# Patient Record
Sex: Female | Born: 1965 | Race: White | Hispanic: No | Marital: Married | Smoking: Former smoker
Health system: Southern US, Community
[De-identification: ages and names within clinical notes are randomized; demographics above are authoritative.]

## PROBLEM LIST (undated history)

## (undated) DIAGNOSIS — I341 Nonrheumatic mitral (valve) prolapse: Secondary | ICD-10-CM

## (undated) DIAGNOSIS — J45909 Unspecified asthma, uncomplicated: Secondary | ICD-10-CM

## (undated) DIAGNOSIS — J189 Pneumonia, unspecified organism: Secondary | ICD-10-CM

## (undated) DIAGNOSIS — K219 Gastro-esophageal reflux disease without esophagitis: Secondary | ICD-10-CM

## (undated) DIAGNOSIS — E079 Disorder of thyroid, unspecified: Secondary | ICD-10-CM

## (undated) HISTORY — PX: MULTIPLE TOOTH EXTRACTIONS: SHX2053

## (undated) HISTORY — PX: KNEE SURGERY: SHX244

## (undated) HISTORY — DX: Unspecified asthma, uncomplicated: J45.909

## (undated) HISTORY — DX: Pneumonia, unspecified organism: J18.9

## (undated) HISTORY — DX: Gastro-esophageal reflux disease without esophagitis: K21.9

## (undated) HISTORY — DX: Disorder of thyroid, unspecified: E07.9

## (undated) HISTORY — PX: WISDOM TOOTH EXTRACTION: SHX21

---

## 1997-12-07 ENCOUNTER — Other Ambulatory Visit: Admission: RE | Admit: 1997-12-07 | Discharge: 1997-12-07 | Payer: Self-pay | Admitting: Gynecology

## 1999-03-06 ENCOUNTER — Other Ambulatory Visit: Admission: RE | Admit: 1999-03-06 | Discharge: 1999-03-06 | Payer: Self-pay | Admitting: Gynecology

## 2000-04-09 ENCOUNTER — Encounter (INDEPENDENT_AMBULATORY_CARE_PROVIDER_SITE_OTHER): Payer: Self-pay

## 2000-04-09 ENCOUNTER — Ambulatory Visit (HOSPITAL_COMMUNITY): Admission: RE | Admit: 2000-04-09 | Discharge: 2000-04-09 | Payer: Self-pay | Admitting: *Deleted

## 2000-04-30 ENCOUNTER — Other Ambulatory Visit: Admission: RE | Admit: 2000-04-30 | Discharge: 2000-04-30 | Payer: Self-pay | Admitting: Gynecology

## 2001-01-04 ENCOUNTER — Ambulatory Visit (HOSPITAL_BASED_OUTPATIENT_CLINIC_OR_DEPARTMENT_OTHER): Admission: RE | Admit: 2001-01-04 | Discharge: 2001-01-04 | Payer: Self-pay | Admitting: *Deleted

## 2003-01-10 ENCOUNTER — Other Ambulatory Visit: Admission: RE | Admit: 2003-01-10 | Discharge: 2003-01-10 | Payer: Self-pay | Admitting: Gynecology

## 2004-06-17 ENCOUNTER — Other Ambulatory Visit: Admission: RE | Admit: 2004-06-17 | Discharge: 2004-06-17 | Payer: Self-pay | Admitting: Gynecology

## 2005-05-21 ENCOUNTER — Emergency Department (HOSPITAL_COMMUNITY): Admission: EM | Admit: 2005-05-21 | Discharge: 2005-05-22 | Payer: Self-pay | Admitting: Emergency Medicine

## 2006-08-20 ENCOUNTER — Ambulatory Visit (HOSPITAL_COMMUNITY): Admission: RE | Admit: 2006-08-20 | Discharge: 2006-08-20 | Payer: Self-pay | Admitting: Obstetrics and Gynecology

## 2006-09-13 ENCOUNTER — Inpatient Hospital Stay (HOSPITAL_COMMUNITY): Admission: AD | Admit: 2006-09-13 | Discharge: 2006-09-15 | Payer: Self-pay | Admitting: Obstetrics and Gynecology

## 2007-07-29 ENCOUNTER — Ambulatory Visit (HOSPITAL_COMMUNITY): Admission: RE | Admit: 2007-07-29 | Discharge: 2007-07-29 | Payer: Self-pay | Admitting: Gynecology

## 2007-11-23 ENCOUNTER — Emergency Department (HOSPITAL_COMMUNITY): Admission: EM | Admit: 2007-11-23 | Discharge: 2007-11-23 | Payer: Self-pay | Admitting: Emergency Medicine

## 2010-01-14 ENCOUNTER — Emergency Department (HOSPITAL_COMMUNITY)
Admission: EM | Admit: 2010-01-14 | Discharge: 2010-01-15 | Payer: Self-pay | Source: Home / Self Care | Admitting: Emergency Medicine

## 2010-04-21 ENCOUNTER — Encounter: Payer: Self-pay | Admitting: Gynecology

## 2010-08-16 NOTE — Op Note (Signed)
[facility]  Patient:    Rhonda Mckinney, Rhonda Mckinney                     MRN: [id number] Proc. Date: 04/09/00 Adm. Date:  [id number] Attending:  Wetzel Bjornstad                           Operative Report  PREOPERATIVE DIAGNOSIS:       Six-week missed abortion.  POSTOPERATIVE DIAGNOSIS:      Six-week missed abortion.  PROCEDURE:                    Suction and dilation and curettage.  SURGEON:                      Katy Fitch, M.D.  ANESTHESIA:                   General by mask.  ESTIMATED BLOOD LOSS:         20 cc.  FLUIDS:                       750 cc of crystalloid.  COMPLICATIONS:                None.  SPECIMENS:                    Products of conception.  FINDINGS:                     An eight-week sized uterus, sounded to 8.0 cm.  DESCRIPTION OF PROCEDURE:     The patient was taken to the operating room and placed in [place] stirrups.  The patient was given general anesthesia by mask. The patient was prepped and draped in a normal sterile fashion.  A bivalve speculum was placed in the vagina and the cervix was identified.  A single tooth tenaculum was placed on the anterior lip of the cervix, and 5 cc of 1% lidocaine was injected at the 3 oclock position, and then at the 9 oclock position.  The uterus was then sounded to 8.0 cm, and the cervix was serially dilated to 25-French.  A #7 curved tipped suction was then placed into the uterus until the fundus was reached, and two passes of the suction were performed.  A sharp curetting of all four quadrants of the uterus was then performed until gritty texture was noted.  Then a repeat suctioning of the uterus was performed.  The single tooth tenaculum was then removed and all hemostasis was noted.  The speculum was removed.  The patient was taken to the recovery room in stable condition. DD:  04/09/00 TD:  04/09/00 Job: [id number]

## 2010-08-16 NOTE — H&P (Signed)
[facility] of [facility]  Patient:    Rhonda Mckinney, Rhonda Mckinney                       MRN: [id number] Attending:  Katy Fitch, M.D.                         History and Physical  CHIEF COMPLAINT:              Missed abortion.  HISTORY OF PRESENT ILLNESS:   Patient is a 45 year old G2, P1 at [redacted] weeks gestation who had one week of spotting over the past week without any cramping or heavy bleeding.  Patient came into office on April 08, 2000 and had an ultrasound noting a fetal pole without any cardiac activity.  Fetus was scanned to be approximately 6 weeks and 1 day and no other abnormalities were noted.  Patient denies any fever, nausea or vomiting, chills, abdominal pain or change in bowel or bladder habits.  PAST MEDICAL HISTORY:         None.  PAST SURGICAL HISTORY:        None.  PAST OBSTETRICAL HISTORY:     Spontaneous vaginal delivery.  PAST GYNECOLOGICAL HISTORY:   History of ASCUS.  MEDICATIONS:                  None.  ALLERGIES:                    None.  SOCIAL HISTORY:               Patient is married.  Denies any tobacco, alcohol or drugs.  FAMILY HISTORY:               Without any epithelial cancers.  PHYSICAL EXAMINATION  VITAL SIGNS:                  Blood pressure 117/80.  HEENT:                        Clear.  LUNGS:                        Clear to auscultation bilaterally.  HEART:                        Regular rate and rhythm, without any murmurs, rubs, or gallops.  ABDOMEN:                      Soft, nontender.  No masses.  Normal abdominal bowel sounds.  PELVIC:                       Cervix closed.  Uterus firm, mobile and nontender.  ASSESSMENT AND PLAN:          [age]-year-old gravida 2, para 1 at 6 weeks with missed abortion.  Blood type is A-positive.  Will undergo suction, dilatation and curettage on April 10, 1999; patient will then follow up in two weeks. DD:  04/09/00 TD:  04/09/00 Job: [id number]

## 2010-08-16 NOTE — Op Note (Signed)
[place]  Patient:    [name], [name] Visit Number: [id number] MRN: [id number]          Service Type: DSU Location: [facility] Attending Physician:  Twana First Dictated by:   Elana Alm Thurston Hole, M.D. Proc. Date: 01/04/01 Admit Date:  01/04/2001                             Operative Report  PREOPERATIVE DIAGNOSIS:  Right knee chondromalacia and synovitis.  POSTOPERATIVE DIAGNOSIS:  Right knee chondromalacia and synovitis.  PROCEDURE: 1. Right knee EUA followed by arthroscopic chondroplasty. 2. Right knee partial synovectomy.  SURGEON:  Elana Alm. Thurston Hole, M.D.  ASSISTANT:  Julien Girt, P.A.  ANESTHESIA:  General.  OPERATIVE TIME:  30 minutes.  COMPLICATIONS:  None.  INDICATIONS FOR PROCEDURE:  Ms. Rhonda Mckinney is a 45 year old woman who has had significant right knee pain on and off for many years, much worse over the past two to three months, increasing in nature, with signs and symptoms and MRI documenting chondromalacia and synovitis who has failed conservative care and is now to undergo arthroscopy.  DESCRIPTION:  Ms. Rhonda Mckinney is brought to the operating room on January 04, 2001, placed on the operative table in supine position.  She received Ancef 1 g IV preoperatively for prophylaxis.  Her right knee was examined under anesthesia.  Range of motion 0-125 degrees, 1-2+ crepitation, knee stable ligamentous exam with normal patella tracking.  The right leg was prepped using sterile Betadine and draped using sterile technique.  Originally through an inferolateral portal the arthroscope with a pump attached was placed, and through an inferior medial portal an arthroscopic probe was placed.  On initial inspection of the medial compartment, the articular cartilage, medial femoral condyle showed 75% grade 3 and 10% grade 4 chondromalacia which was thoroughly debrided.  The medial tibial plateau showed 30-40% grade 3 chondromalacia  which was debrided.  The medial meniscus was probed and this was found to be intact.  ACL, PCL was normal.  The lateral compartment articular cartilage was normal, lateral meniscus normal.  Patellofemoral joint showed 25% grade 3 chondromalacia on the femoral groove and the medial patella facet, which was debrided.  The patella tracked normally.  Otherwise, grade 1 and 2 changes noted in the patellofemoral joint.  Significant synovitis in the medial and lateral gutters was debrided and the synovial bleeders were cauterized; otherwise they were free of pathology.  After this was done it was felt that all pathology had been satisfactorily addressed.  The instruments were removed.  Portals closed with 3-0 nylon suture and injected with 0.25% Marcaine with epinephrine and morphine 4 mg.  Sterile dressings applied and the patient awakened and taken to the recovery room in stable condition.  FOLLOW-UP CARE:  Ms. Rhonda Mckinney will be followed as an outpatient on Vicodin and Naprosyn.  See her back in the office in a week for sutures out and follow-up. Dictated by:   Elana Alm Thurston Hole, M.D. Attending Physician:  Twana First DD:  01/04/01 TD:  01/04/01 Job: [id number]

## 2011-01-15 LAB — CBC
HCT: 31.3 — ABNORMAL LOW
HCT: 33.8 — ABNORMAL LOW
Hemoglobin: 10.7 — ABNORMAL LOW
Hemoglobin: 11.6 — ABNORMAL LOW
MCHC: 34.1
RBC: 3.55 — ABNORMAL LOW
RBC: 3.82 — ABNORMAL LOW
RBC: 4.11
RDW: 14.5 — ABNORMAL HIGH
WBC: 17 — ABNORMAL HIGH
WBC: 17.9 — ABNORMAL HIGH

## 2011-01-15 LAB — RPR: RPR Ser Ql: NONREACTIVE

## 2012-04-26 ENCOUNTER — Encounter (HOSPITAL_COMMUNITY): Payer: Self-pay | Admitting: *Deleted

## 2012-04-26 ENCOUNTER — Emergency Department (HOSPITAL_COMMUNITY)
Admission: EM | Admit: 2012-04-26 | Discharge: 2012-04-26 | Disposition: A | Discharge status: Home / Self Care | Payer: Self-pay | Attending: Emergency Medicine | Admitting: Emergency Medicine

## 2012-04-26 DIAGNOSIS — M7120 Synovial cyst of popliteal space [Baker], unspecified knee: Secondary | ICD-10-CM | POA: Insufficient documentation

## 2012-04-26 DIAGNOSIS — X58XXXA Exposure to other specified factors, initial encounter: Secondary | ICD-10-CM | POA: Insufficient documentation

## 2012-04-26 DIAGNOSIS — Y929 Unspecified place or not applicable: Secondary | ICD-10-CM | POA: Insufficient documentation

## 2012-04-26 DIAGNOSIS — Y939 Activity, unspecified: Secondary | ICD-10-CM | POA: Insufficient documentation

## 2012-04-26 DIAGNOSIS — Z79899 Other long term (current) drug therapy: Secondary | ICD-10-CM | POA: Insufficient documentation

## 2012-04-26 MED ORDER — HYDROCODONE-ACETAMINOPHEN 5-325 MG PO TABS: 1.0000 | ORAL_TABLET | ORAL | Status: DC | PRN

## 2012-04-26 MED ORDER — ACETAMINOPHEN 325 MG PO TABS
650.0000 mg | ORAL_TABLET | Freq: Once | ORAL | Status: AC
  Administered 2012-04-26: 650 mg via ORAL
  Filled 2012-04-26: qty 2

## 2012-04-26 NOTE — ED Notes (Signed)
Pt reports pain behind her (L) knee that started 4-5 days ago.  Denies long car rides, denies injury.  No redness, bruising or warmth noted.  Pt ambulatory.

## 2012-04-26 NOTE — ED Provider Notes (Signed)
History  This chart was scribed for Rhonda Booze, MD by Marlin Canary ED Scribe. The patient was seen in room TR08C/TR08C. Patient's care was started at 2104.  CSN: [id number]  Arrival date & time 04/26/12  2104   First MD Initiated Contact with Patient 04/26/12 2125      Chief Complaint  Patient presents with  . Leg Injury    The history is provided by the patient. No language interpreter was used.  Rhonda Mckinney is a 47 y.o. female who presents to the Emergency Department complaining of constant moderate left knee pain with an onset of 4-5 days ago. She states the pain is located behind her knee and has been gradually worsening. She says she does a lot of bending, twisting at work. She denies any recent long travels, surgeries or known injuries. Pt is not on birth control and denies smoking.  History reviewed. No pertinent past medical history.  No past surgical history on file.  No family history on file.  History  Substance Use Topics  . Smoking status: Not on file  . Smokeless tobacco: Not on file  . Alcohol Use: Not on file      Review of Systems  Musculoskeletal: Positive for arthralgias.  All other systems reviewed and are negative.    Allergies  Asa  Home Medications   Current Outpatient Rx  Name  Route  Sig  Dispense  Refill  . ESCITALOPRAM OXALATE 10 MG PO TABS   Oral   Take 10 mg by mouth daily.         . IBUPROFEN 200 MG PO TABS   Oral   Take 800 mg by mouth every 6 (six) hours as needed. For pain         . VITAMIN C 500 MG PO TABS   Oral   Take 1,000 mg by mouth daily.           BP 149/60  Pulse 73  Temp 98.7 F (37.1 C) (Oral)  Resp 20  SpO2 98%  Physical Exam  Nursing note and vitals reviewed. Constitutional: She is oriented to person, place, and time. She appears well-developed and well-nourished. No distress.  HENT:  Head: Normocephalic and atraumatic.  Eyes: EOM are normal.  Neck: Neck supple. No tracheal deviation  present.  Cardiovascular: Normal rate, regular rhythm and normal heart sounds.   Pulmonary/Chest: Effort normal and breath sounds normal. No respiratory distress.  Abdominal: Soft. Bowel sounds are normal. There is no tenderness.  Musculoskeletal: Normal range of motion. She exhibits tenderness.       Mild swelling to the left popliteal. Mild tenderness in the left calf. No instability, cords or difference in calf circumference noted.    Neurological: She is alert and oriented to person, place, and time.  Skin: Skin is warm and dry.  Psychiatric: She has a normal mood and affect. Her behavior is normal.    ED Course  Procedures (including critical care time) DIAGNOSTIC STUDIES: Oxygen Saturation is 98% on room air, Normal  by my interpretation.    COORDINATION OF CARE: 2143-Patient informed of current plan for treatment and evaluation and agrees with plan at this time.   Results for orders placed during the hospital encounter of 04/26/12  D-DIMER, QUANTITATIVE      Component Value Range   D-Dimer, Quant <0.27  0.00 - 0.48 ug/mL-FEU     1. Baker's cyst of knee       MDM  Left knee pain most  consistent with Baker's cyst. Venous imaging is not available, so d-dimer will be obtained to try and rule out DVT.  D-dimer is come back normal. She will be sent home with instructions to take anti-inflammatory doses of either naproxen or ibuprofen. Prescription is given for Norco for pain and she is referred to orthopedics for followup.      I personally performed the services described in this documentation, which was scribed in my presence. The recorded information has been reviewed and is accurate.      Rhonda Booze, MD 04/26/12 2225

## 2013-03-23 ENCOUNTER — Emergency Department (HOSPITAL_COMMUNITY)
Admission: EM | Admit: 2013-03-23 | Discharge: 2013-03-23 | Disposition: A | Discharge status: Home / Self Care | Payer: Self-pay | Attending: Emergency Medicine | Admitting: Emergency Medicine

## 2013-03-23 ENCOUNTER — Encounter (HOSPITAL_COMMUNITY): Payer: Self-pay | Admitting: Emergency Medicine

## 2013-03-23 ENCOUNTER — Emergency Department (HOSPITAL_COMMUNITY): Payer: Self-pay

## 2013-03-23 DIAGNOSIS — Z79899 Other long term (current) drug therapy: Secondary | ICD-10-CM | POA: Insufficient documentation

## 2013-03-23 DIAGNOSIS — B349 Viral infection, unspecified: Secondary | ICD-10-CM

## 2013-03-23 DIAGNOSIS — J029 Acute pharyngitis, unspecified: Secondary | ICD-10-CM | POA: Insufficient documentation

## 2013-03-23 DIAGNOSIS — R52 Pain, unspecified: Secondary | ICD-10-CM | POA: Insufficient documentation

## 2013-03-23 DIAGNOSIS — R51 Headache: Secondary | ICD-10-CM | POA: Insufficient documentation

## 2013-03-23 DIAGNOSIS — R1110 Vomiting, unspecified: Secondary | ICD-10-CM | POA: Insufficient documentation

## 2013-03-23 DIAGNOSIS — Z3202 Encounter for pregnancy test, result negative: Secondary | ICD-10-CM | POA: Insufficient documentation

## 2013-03-23 DIAGNOSIS — Z8679 Personal history of other diseases of the circulatory system: Secondary | ICD-10-CM | POA: Insufficient documentation

## 2013-03-23 DIAGNOSIS — R000 Tachycardia, unspecified: Secondary | ICD-10-CM | POA: Insufficient documentation

## 2013-03-23 DIAGNOSIS — B9789 Other viral agents as the cause of diseases classified elsewhere: Secondary | ICD-10-CM | POA: Insufficient documentation

## 2013-03-23 HISTORY — DX: Nonrheumatic mitral (valve) prolapse: I34.1

## 2013-03-23 LAB — URINALYSIS, ROUTINE W REFLEX MICROSCOPIC
Bilirubin Urine: NEGATIVE
Glucose, UA: NEGATIVE mg/dL
Hgb urine dipstick: NEGATIVE
Ketones, ur: 15 mg/dL — AB
Protein, ur: NEGATIVE mg/dL
Urobilinogen, UA: 0.2 mg/dL (ref 0.0–1.0)

## 2013-03-23 LAB — CBC WITH DIFFERENTIAL/PLATELET
Eosinophils Absolute: 0.1 10*3/uL (ref 0.0–0.7)
Eosinophils Relative: 1 % (ref 0–5)
HCT: 38 % (ref 36.0–46.0)
Lymphs Abs: 1 10*3/uL (ref 0.7–4.0)
MCH: 27.5 pg (ref 26.0–34.0)
MCV: 83.5 fL (ref 78.0–100.0)
Monocytes Absolute: 1.2 10*3/uL — ABNORMAL HIGH (ref 0.1–1.0)
Platelets: 412 10*3/uL — ABNORMAL HIGH (ref 150–400)
RDW: 14.5 % (ref 11.5–15.5)

## 2013-03-23 LAB — BASIC METABOLIC PANEL
CO2: 22 mEq/L (ref 19–32)
Calcium: 9 mg/dL (ref 8.4–10.5)
Creatinine, Ser: 0.68 mg/dL (ref 0.50–1.10)
GFR calc non Af Amer: >90 mL/min (ref >90)
Glucose, Bld: 118 mg/dL — ABNORMAL HIGH (ref 70–99)
Sodium: 136 mEq/L (ref 135–145)

## 2013-03-23 LAB — URINE MICROSCOPIC-ADD ON

## 2013-03-23 LAB — PREGNANCY, URINE: Preg Test, Ur: NEGATIVE

## 2013-03-23 MED ORDER — SODIUM CHLORIDE 0.9 % IV BOLUS (SEPSIS)
1000.0000 mL | Freq: Once | INTRAVENOUS | Status: AC
  Administered 2013-03-23: 1000 mL via INTRAVENOUS

## 2013-03-23 MED ORDER — IBUPROFEN 800 MG PO TABS
800.0000 mg | ORAL_TABLET | Freq: Once | ORAL | Status: AC
  Administered 2013-03-23: 800 mg via ORAL
  Filled 2013-03-23: qty 1

## 2013-03-23 NOTE — ED Provider Notes (Signed)
CSN: [id number]     Arrival date & time 03/23/13  1049 History   None    Chief Complaint  Patient presents with  . Influenza    HPI  Rhonda Mckinney is a 47 y.o. female with a PMH of MVP who presents to the ED for evaluation of influenza.  History was provided by the patient.  Patient states she has had "flu like symptoms" x 1 week.  She states "I think I have pneumonia."  Her symptoms include fatigue, generalized weakness, headache, nasal congestion, productive cough, myalgias, and sore throat.  She is unsure what her sputum looks like but denies hemoptysis.  Her sore throat feels "scratchy."  She also reports subjective fever and chills but did not take her temperature.  She had one episode of emesis this morning.  She denies any chest pain, SOB, wheezing, focal weakness, abdominal pain, diarrhea, vaginal discharge, dysuria, vaginal bleeding/discharge, vision changes, syncope, lightheadedness, or dizziness.  She took Ibuprofen PTA which improved her headache.  She denies any known sick contacts.  No recent travel.  She did not get the flu shot this year.     Past Medical History  Diagnosis Date  . Mitral valve prolapse    History reviewed. No pertinent past surgical history. History reviewed. No pertinent family history. History  Substance Use Topics  . Smoking status: Not on file  . Smokeless tobacco: Not on file  . Alcohol Use: No   OB History   Grav Para Term Preterm Abortions TAB SAB Ect Mult Living                 Review of Systems  Constitutional: Positive for fever, chills and fatigue. Negative for diaphoresis, activity change and appetite change.  HENT: Positive for congestion, postnasal drip, rhinorrhea and sore throat. Negative for drooling, ear pain, mouth sores, sinus pressure, tinnitus, trouble swallowing and voice change.   Eyes: Negative for photophobia, pain and visual disturbance.  Respiratory: Positive for cough. Negative for apnea, choking, shortness of breath  and wheezing.   Cardiovascular: Negative for chest pain and leg swelling.  Gastrointestinal: Positive for vomiting. Negative for nausea, abdominal pain, diarrhea and constipation.  Genitourinary: Negative for dysuria, vaginal bleeding, vaginal discharge and difficulty urinating.  Musculoskeletal: Positive for myalgias. Negative for back pain, gait problem, neck pain and neck stiffness.  Skin: Negative for rash and wound.  Neurological: Positive for weakness (generalized weakness) and headaches. Negative for dizziness and light-headedness.    Allergies  Asa  Home Medications   Current Outpatient Rx  Name  Route  Sig  Dispense  Refill  . escitalopram (LEXAPRO) 10 MG tablet   Oral   Take 10 mg by mouth daily.         Marland Kitchen ibuprofen (ADVIL,MOTRIN) 200 MG tablet   Oral   Take 800 mg by mouth every 6 (six) hours as needed. For pain         . Pseudoeph-Doxylamine-DM-APAP (NYQUIL PO)   Oral   Take 2 capsules by mouth daily as needed (congestion).         . vitamin C (ASCORBIC ACID) 500 MG tablet   Oral   Take 1,000 mg by mouth daily.          BP 116/69  Pulse 120  Temp(Src) 101.3 F (38.5 C) (Oral)  Resp 18  SpO2 96%  Filed Vitals:   03/23/13 1118 03/23/13 1343 03/23/13 1417 03/23/13 1527  BP: 115/66  109/60 115/68  Pulse: 113 91  92 87  Temp: 99.7 F (37.6 C) 99.4 F (37.4 C) 98.1 F (36.7 C) 99.5 F (37.5 C)  TempSrc: Oral Oral Oral Oral  Resp: 20 21 16 20   SpO2: 96% 97% 97% 99%    Physical Exam  Nursing note and vitals reviewed. Constitutional: She is oriented to person, place, and time. She appears well-developed and well-nourished. No distress.  Ill appearing  HENT:  Head: Normocephalic and atraumatic.  Right Ear: External ear normal.  Left Ear: External ear normal.  Nose: Nose normal.  Mouth/Throat: Oropharynx is clear and moist. No oropharyngeal exudate.  Tympanic membranes gray and translucent bilaterally.  No erythema or exudates to the posterior  pharynx.  Nasal congestion.    Eyes: Conjunctivae are normal. Pupils are equal, round, and reactive to light. Right eye exhibits no discharge. Left eye exhibits no discharge.  Neck: Normal range of motion. Neck supple.  No LAD.  No cervical spinal or paraspinal tenderness to palpation throughout.  No limitations with neck ROM.    Cardiovascular: Regular rhythm, normal heart sounds and intact distal pulses.  Exam reveals no gallop and no friction rub.   No murmur heard. Tachycardic  Pulmonary/Chest: Effort normal and breath sounds normal. No respiratory distress. She has no wheezes. She has no rales. She exhibits no tenderness.  Abdominal: Soft. Bowel sounds are normal. She exhibits no distension and no mass. There is no tenderness. There is no rebound and no guarding.  Musculoskeletal: Normal range of motion. She exhibits no edema and no tenderness.  Neurological: She is alert and oriented to person, place, and time.  Skin: Skin is warm and dry. She is not diaphoretic.    ED Course  Procedures (including critical care time) Labs Review Labs Reviewed - No data to display Imaging Review No results found.  EKG Interpretation   None      Results for orders placed during the hospital encounter of 03/23/13  CBC WITH DIFFERENTIAL      Result Value Range   WBC 14.8 (*) 4.0 - 10.5 K/uL   RBC 4.55  3.87 - 5.11 MIL/uL   Hemoglobin 12.5  12.0 - 15.0 g/dL   HCT 40.9  81.1 - 91.4 %   MCV 83.5  78.0 - 100.0 fL   MCH 27.5  26.0 - 34.0 pg   MCHC 32.9  30.0 - 36.0 g/dL   RDW 78.2  95.6 - 21.3 %   Platelets 412 (*) 150 - 400 K/uL   Neutrophils Relative % 84 (*) 43 - 77 %   Neutro Abs 12.5 (*) 1.7 - 7.7 K/uL   Lymphocytes Relative 7 (*) 12 - 46 %   Lymphs Abs 1.0  0.7 - 4.0 K/uL   Monocytes Relative 8  3 - 12 %   Monocytes Absolute 1.2 (*) 0.1 - 1.0 K/uL   Eosinophils Relative 1  0 - 5 %   Eosinophils Absolute 0.1  0.0 - 0.7 K/uL   Basophils Relative 0  0 - 1 %   Basophils Absolute 0.0   0.0 - 0.1 K/uL  BASIC METABOLIC PANEL      Result Value Range   Sodium 136  135 - 145 mEq/L   Potassium 3.1 (*) 3.5 - 5.1 mEq/L   Chloride 101  96 - 112 mEq/L   CO2 22  19 - 32 mEq/L   Glucose, Bld 118 (*) 70 - 99 mg/dL   BUN 8  6 - 23 mg/dL   Creatinine, Ser 0.86  0.50 -  1.10 mg/dL   Calcium 9.0  8.4 - 40.9 mg/dL   GFR calc non Af Amer >90  >90 mL/min   GFR calc Af Amer >90  >90 mL/min  URINALYSIS, ROUTINE W REFLEX MICROSCOPIC      Result Value Range   Color, Urine YELLOW  YELLOW   APPearance CLEAR  CLEAR   Specific Gravity, Urine 1.010  1.005 - 1.030   pH 6.0  5.0 - 8.0   Glucose, UA NEGATIVE  NEGATIVE mg/dL   Hgb urine dipstick NEGATIVE  NEGATIVE   Bilirubin Urine NEGATIVE  NEGATIVE   Ketones, ur 15 (*) NEGATIVE mg/dL   Protein, ur NEGATIVE  NEGATIVE mg/dL   Urobilinogen, UA 0.2  0.0 - 1.0 mg/dL   Nitrite NEGATIVE  NEGATIVE   Leukocytes, UA TRACE (*) NEGATIVE  PREGNANCY, URINE      Result Value Range   Preg Test, Ur NEGATIVE  NEGATIVE  URINE MICROSCOPIC-ADD ON      Result Value Range   Squamous Epithelial / LPF FEW (*) RARE   WBC, UA 0-2  <3 WBC/hpf   Bacteria, UA RARE  RARE   Urine-Other MUCOUS PRESENT     DG Chest 2 View (Final result)  Result time: 03/23/13 12:04:38    Final result by Rad Results In Interface (03/23/13 12:04:38)    Narrative:   CLINICAL DATA: Chest pain, weakness and cough.  EXAM: CHEST - 2 VIEW  COMPARISON: 05/21/2005  FINDINGS: The heart size and mediastinal contours are within normal limits. There is no evidence of pulmonary edema, consolidation, pneumothorax, nodule or pleural fluid. The visualized skeletal structures are unremarkable.  IMPRESSION: No active disease.   Electronically Signed By: Irish Lack M.D. On: 03/23/2013 12:04      MDM   ELYN KROGH is a 47 y.o. female with a PMH of MVP who presents to the ED for evaluation of influenza.   Rechecks  1:00 PM = Patient states she feels tired.  Ordering  another liter of fluids.  Pain controlled.   3:35 PM = Patient states she feels a little better.  Myalgias returning.  Ibuprofen ordered.      Patient likely has influenza or another viral syndrome.  Patient had a fever of 101.3 which reduced in the ED.  She had improvements in her symptoms with IV fluids.  Chest x-ray negative for an acute cardiopulmonary process.  Urine negative for a UTI.  Mild leukocytosis, however, doubt bacterial process.  Patient has URI like complaints including nasal congestion, rhinorrhea, sore throat, and cough.  No meningeal signs/stiff neck/severe headache/neck pain. Mild hypokalemia likely from decreased intake and vomiting.  Patient encouraged to eat a potassium rich diet.  Instructions provided.  Tachycardia resolved with IV fluids, which was likely due to dehydration.  Patient encouraged to rest and drink fluids.  Encouraged to follow-up with her PCP if her symptoms are not improving or worsening. Return precautions, discharge instructions, and follow-up was discussed with the patient before discharge.     Discharge Medication List as of 03/23/2013  3:41 PM       Final impressions: 1. Viral syndrome      Luiz Iron PA-C   This patient was discussed with Dr. Verne Carrow, PA-C 03/25/13 [phone]

## 2013-03-23 NOTE — ED Notes (Signed)
PT endorses "head cold" for few weeks. Fever since yesterday. Non-productive cough, emesis. Ibuprofen 800mg  taken PTA. Nyquil taken at 0200.

## 2013-03-23 NOTE — ED Notes (Signed)
Pt reports flu like symptoms. Had cold symptoms for weeks and now having n/v x 1, body pain, productive cough and fever/chills. Mask on pt at triage.

## 2013-04-07 NOTE — ED Provider Notes (Signed)
Medical screening examination/treatment/procedure(s) were performed by non-physician practitioner and as supervising physician I was immediately available for consultation/collaboration.  EKG Interpretation   None        Derwood KaplanAnkit Clayborn Milnes, MD 04/07/13 1815

## 2015-08-22 ENCOUNTER — Encounter (HOSPITAL_COMMUNITY): Payer: Self-pay | Admitting: Family Medicine

## 2015-08-22 ENCOUNTER — Emergency Department (HOSPITAL_COMMUNITY): Payer: Self-pay

## 2015-08-22 ENCOUNTER — Emergency Department (HOSPITAL_COMMUNITY)
Admission: EM | Admit: 2015-08-22 | Discharge: 2015-08-22 | Disposition: A | Discharge status: Home / Self Care | Payer: Self-pay | Attending: Emergency Medicine | Admitting: Emergency Medicine

## 2015-08-22 DIAGNOSIS — R0982 Postnasal drip: Secondary | ICD-10-CM | POA: Insufficient documentation

## 2015-08-22 DIAGNOSIS — J029 Acute pharyngitis, unspecified: Secondary | ICD-10-CM | POA: Insufficient documentation

## 2015-08-22 DIAGNOSIS — Z8679 Personal history of other diseases of the circulatory system: Secondary | ICD-10-CM | POA: Insufficient documentation

## 2015-08-22 DIAGNOSIS — J3489 Other specified disorders of nose and nasal sinuses: Secondary | ICD-10-CM | POA: Insufficient documentation

## 2015-08-22 DIAGNOSIS — R509 Fever, unspecified: Secondary | ICD-10-CM | POA: Insufficient documentation

## 2015-08-22 DIAGNOSIS — R05 Cough: Secondary | ICD-10-CM | POA: Insufficient documentation

## 2015-08-22 DIAGNOSIS — Z79899 Other long term (current) drug therapy: Secondary | ICD-10-CM | POA: Insufficient documentation

## 2015-08-22 DIAGNOSIS — R059 Cough, unspecified: Secondary | ICD-10-CM

## 2015-08-22 MED ORDER — AZITHROMYCIN 250 MG PO TABS: 250.0000 mg | ORAL_TABLET | Freq: Every day | ORAL | Status: DC

## 2015-08-22 MED ORDER — BENZONATATE 100 MG PO CAPS: 200.0000 mg | ORAL_CAPSULE | Freq: Two times a day (BID) | ORAL | Status: DC | PRN

## 2015-08-22 NOTE — Discharge Instructions (Signed)

## 2015-08-22 NOTE — ED Provider Notes (Signed)
CSN: [id number]     Arrival date & time 08/22/15  1120 History  By signing my name below, I, Essence Howell, attest that this documentation has been prepared under the direction and in the presence of Roxy Horseman, PA-C. Electronically Signed: Charline Bills, ED Scribe 08/22/2015 at 12:28 PM.    Chief Complaint  Patient presents with  . Cough  . Fever   HPI Comments: Patient presents to the emergency department with chief complaint of cough, fever, and sore throat. She states that she has been sick for the past 2 weeks. She states that she was starting to feel better, but then her symptoms worsened. She reports some associated nausea, but denies any vomiting. She's been taking DayQuil with modest relief. She denies any other associated symptoms. There are no modifying factors.  The history is provided by the patient. No language interpreter was used.     Past Medical History  Diagnosis Date  . Mitral valve prolapse    History reviewed. No pertinent past surgical history. History reviewed. No pertinent family history. Social History  Substance Use Topics  . Smoking status: Never Smoker   . Smokeless tobacco: None  . Alcohol Use: No   OB History    No data available     Review of Systems  Constitutional: Positive for fever and chills.  HENT: Positive for postnasal drip, rhinorrhea, sinus pressure, sneezing and sore throat.   Respiratory: Positive for cough. Negative for shortness of breath.   Cardiovascular: Negative for chest pain.  Gastrointestinal: Negative for nausea, vomiting, abdominal pain, diarrhea and constipation.  Genitourinary: Negative for dysuria.      Allergies  Asa  Home Medications   Prior to Admission medications   Medication Sig Start Date End Date Taking? Authorizing Provider  escitalopram (LEXAPRO) 10 MG tablet Take 10 mg by mouth daily.    Historical Provider, MD  ibuprofen (ADVIL,MOTRIN) 200 MG tablet Take 800 mg by mouth every 6 (six) hours as  needed. For pain    Historical Provider, MD  Pseudoeph-Doxylamine-DM-APAP (NYQUIL PO) Take 2 capsules by mouth daily as needed (congestion).    Historical Provider, MD  vitamin C (ASCORBIC ACID) 500 MG tablet Take 1,000 mg by mouth daily.    Historical Provider, MD   Triage Vitals: BP 120/75 mmHg  Pulse 109  Temp(Src) 98.7 F (37.1 C) (Oral)  Resp 18  Ht  (1.676 m)  Wt 167 lb (75.751 kg)  BMI 26.97 kg/m2  SpO2 93%  LMP 08/18/2015 Physical Exam Physical Exam  Constitutional: Pt  is oriented to person, place, and time. Appears well-developed and well-nourished. No distress.  HENT:  Head: Normocephalic and atraumatic.  Right Ear: Tympanic membrane, external ear and ear canal normal.  Left Ear: Tympanic membrane, external ear and ear canal normal.  Nose: Mucosal edema and moderate rhinorrhea present. No epistaxis. Right sinus exhibits no maxillary sinus tenderness and no frontal sinus tenderness. Left sinus exhibits no maxillary sinus tenderness and no frontal sinus tenderness.  Mouth/Throat: Uvula is midline and mucous membranes are normal. Mucous membranes are not pale and not cyanotic. No oropharyngeal exudate, posterior oropharyngeal edema, posterior oropharyngeal erythema or tonsillar abscesses.  Eyes: Conjunctivae are normal. Pupils are equal, round, and reactive to light.  Neck: Normal range of motion and full passive range of motion without pain.  Cardiovascular: Normal rate and intact distal pulses.   Pulmonary/Chest: Effort normal and breath sounds normal. No stridor.  Clear and equal breath sounds without focal wheezes, rhonchi, rales  Abdominal: Soft. Bowel sounds are normal. There is no tenderness.  Musculoskeletal: Normal range of motion.  Lymphadenopathy:    Pthas no cervical adenopathy.  Neurological: Pt is alert and oriented to person, place, and time.  Skin: Skin is warm and dry. No rash noted. Pt is not diaphoretic.  Psychiatric: Normal mood and affect.  Nursing  note and vitals reviewed.   ED Course  Procedures  DIAGNOSTIC STUDIES: Oxygen Saturation is 93% on RA, adequate by my interpretation.      Labs Review Labs Reviewed - No data to display  Imaging Review Dg Chest 2 View  08/22/2015  CLINICAL DATA:  Cough and congestion for 3 days EXAM: CHEST  2 VIEW COMPARISON:  03/23/2013 FINDINGS: Cardiac shadow is within normal limits. The lungs are well aerated bilaterally. No focal infiltrate or sizable effusion is seen. No acute bony abnormality is noted. IMPRESSION: No active cardiopulmonary disease. Electronically Signed   By: Alcide CleverMark  Lukens M.D.   On: 08/22/2015 12:19   I have personally reviewed and evaluated these images and lab results as part of my medical decision-making.   EKG Interpretation None      MDM   Final diagnoses:  Cough    Pt CXR negative for acute infiltrate. Patients symptoms are consistent with URI, likely viral etiology, but as she has worsened in the past week, may benefit from a short course of azithromycin. Pt will be discharged with symptomatic treatment.  Verbalizes understanding and is agreeable with plan. Pt is hemodynamically stable & in NAD prior to dc.  I personally performed the services described in this documentation, which was scribed in my presence. The recorded information has been reviewed and is accurate.      Roxy HorsemanRobert Garey Alleva, PA-C 08/22/15 1242  Rolland PorterMark James, MD 09/06/15 2245

## 2015-08-22 NOTE — ED Notes (Signed)
Pt here for cough, fever, sore throat. sts some nausea. sts she has been taking dayquil

## 2018-10-26 ENCOUNTER — Emergency Department (HOSPITAL_COMMUNITY)
Admission: EM | Admit: 2018-10-26 | Discharge: 2018-10-26 | Discharge status: Against Medical Advice | Payer: Self-pay | Attending: Emergency Medicine | Admitting: Emergency Medicine

## 2018-10-26 ENCOUNTER — Encounter (HOSPITAL_COMMUNITY): Payer: Self-pay | Admitting: Emergency Medicine

## 2018-10-26 ENCOUNTER — Other Ambulatory Visit: Payer: Self-pay

## 2018-10-26 ENCOUNTER — Emergency Department (HOSPITAL_COMMUNITY): Payer: Self-pay

## 2018-10-26 DIAGNOSIS — Z5321 Procedure and treatment not carried out due to patient leaving prior to being seen by health care provider: Secondary | ICD-10-CM | POA: Insufficient documentation

## 2018-10-26 LAB — CBC
HCT: 41.9 % (ref 36.0–46.0)
Hemoglobin: 13.7 g/dL (ref 12.0–15.0)
MCH: 28.3 pg (ref 26.0–34.0)
MCHC: 32.7 g/dL (ref 30.0–36.0)
MCV: 86.6 fL (ref 80.0–100.0)
Platelets: 431 10*3/uL — ABNORMAL HIGH (ref 150–400)
RBC: 4.84 MIL/uL (ref 3.87–5.11)
RDW: 14.6 % (ref 11.5–15.5)
WBC: 10.2 10*3/uL (ref 4.0–10.5)
nRBC: 0 % (ref 0.0–0.2)

## 2018-10-26 LAB — BASIC METABOLIC PANEL
Anion gap: 12 (ref 5–15)
BUN: 8 mg/dL (ref 6–20)
CO2: 21 mmol/L — ABNORMAL LOW (ref 22–32)
Calcium: 9.3 mg/dL (ref 8.9–10.3)
Chloride: 105 mmol/L (ref 98–111)
Creatinine, Ser: 0.83 mg/dL (ref 0.44–1.00)
GFR calc Af Amer: >60 mL/min (ref >60)
GFR calc non Af Amer: >60 mL/min (ref >60)
Glucose, Bld: 123 mg/dL — ABNORMAL HIGH (ref 70–99)
Potassium: 3.5 mmol/L (ref 3.5–5.1)
Sodium: 138 mmol/L (ref 135–145)

## 2018-10-26 LAB — TROPONIN I (HIGH SENSITIVITY): Troponin I (High Sensitivity): 2 ng/L (ref <18)

## 2018-10-26 MED ORDER — SODIUM CHLORIDE 0.9% FLUSH: 3.0000 mL | Freq: Once | INTRAVENOUS | Status: DC

## 2018-10-26 NOTE — ED Notes (Signed)
Patient decided to leave, advised to stay.

## 2018-10-26 NOTE — ED Triage Notes (Signed)
Pt reports having numbness to her L arm and leg starting today around 12. Pt doe snot have any neuro deficits at this time. Pt reports intermittent chest pain x 2-3 weeks PCP started pt on Protonix with no relief so pt discontinued.  Pt also reports intermittent swelling to her hands and feet.

## 2019-07-15 ENCOUNTER — Encounter: Payer: Self-pay | Admitting: Gastroenterology

## 2019-08-12 ENCOUNTER — Ambulatory Visit: Payer: Self-pay | Admitting: Gastroenterology

## 2019-09-15 ENCOUNTER — Ambulatory Visit: Payer: No Typology Code available for payment source | Admitting: Gastroenterology

## 2019-09-15 ENCOUNTER — Encounter: Payer: Self-pay | Admitting: Gastroenterology

## 2019-09-15 VITALS — BP 106/68 | HR 92 | Temp 96.0°F | Ht 67.0 in | Wt 140.5 lb

## 2019-09-15 DIAGNOSIS — Z1211 Encounter for screening for malignant neoplasm of colon: Secondary | ICD-10-CM | POA: Diagnosis not present

## 2019-09-15 DIAGNOSIS — K219 Gastro-esophageal reflux disease without esophagitis: Secondary | ICD-10-CM

## 2019-09-15 NOTE — Progress Notes (Signed)
Chief Complaint: Reflux  Referring Provider:  Dema Severin, NP      ASSESSMENT AND PLAN;   #1. GERD (much better).  Highly intolerant to omeprazole/Protonix.  #2. Screening colon  Plan: - Nonpharmacological means of relux constrol - Wants to hold off on EGD/colon. - FU in 6 months.  Will discuss again regarding colonoscopy at follow-up visit.   HPI:    Rhonda Mckinney is a 54 y.o. female  Sent to GI clinic by Mauricio Po for reflux/LPR. She was given empiric trial of omeprazole and Protonix.  However, she could not tolerate either of the medications and had significant side effects with Protonix -including abdominal pain, anorexia.  She has stopped taking those medicines.  Feels significantly better from GI standpoint.  Was going to cancel this appointment.  No nausea, vomiting, heartburn, regurgitation, odynophagia or dysphagia.  No significant diarrhea or constipation.  No melena or hematochezia. No unintentional weight loss. No abdominal pain.  She is currently being evaluated for positive ANA/autoimmune problems/thyroid problems.  She also wants to hold off on any endoscopy or colonoscopy.  She does understand current recommendations regarding screening colonoscopy.  She does understand that there is small but definite risks of missing colorectal neoplasms.   Past Medical History:  Diagnosis Date  . Asthma   . Asthma   . LPRD (laryngopharyngeal reflux disease)   . Mitral valve prolapse   . PNA (pneumonia)   . Thyroid disease     Past Surgical History:  Procedure Laterality Date  . KNEE SURGERY    . MULTIPLE TOOTH EXTRACTIONS    . WISDOM TOOTH EXTRACTION      Family History  Problem Relation Age of Onset  . Breast cancer Mother   . Crohn's disease Mother   . Diabetes Mother   . Cirrhosis Mother   . Colon polyps Brother   . Diverticulitis Brother     Social History   Tobacco Use  . Smoking status: Former Games developer  . Smokeless tobacco: Never Used    Vaping Use  . Vaping Use: Never used  Substance Use Topics  . Alcohol use: No  . Drug use: No    Current Outpatient Medications  Medication Sig Dispense Refill  . COLLAGEN PO Take by mouth daily.    . Multiple Vitamin (MULTIVITAMIN) tablet Take 1 tablet by mouth as needed.     No current facility-administered medications for this visit.    Allergies  Allergen Reactions  . Almond (Diagnostic)   . Banana   . Egg [Eggs Or Egg-Derived Products]   . Milk-Related Compounds   . Wheat Bran   . Asa [Aspirin] Nausea Only    Review of Systems:  Constitutional: Denies fever, chills, diaphoresis, appetite change and fatigue.  HEENT: Has sinus problems.   Respiratory: Denies SOB, DOE, cough, chest tightness,  and wheezing.   Cardiovascular: Denies chest pain, palpitations and leg swelling.  Genitourinary: Denies dysuria, urgency, frequency, hematuria, flank pain and difficulty urinating.  Musculoskeletal: Denies myalgias, back pain, joint swelling, arthralgias and gait problem.  Skin: No rash.  Neurological: Denies dizziness, seizures, syncope, weakness, light-headedness, numbness and headaches.  Hematological: Denies adenopathy. Easy bruising, personal or family bleeding history  Psychiatric/Behavioral: Has  Anxiety, no  depression     Physical Exam:    BP 106/68   Pulse 92   Temp (!) 96 F (35.6 C)   Ht 5\' 7"  (1.702 m)   Wt 140 lb 8 oz (63.7 kg)  BMI 22.01 kg/m  Wt Readings from Last 3 Encounters:  09/15/19 140 lb 8 oz (63.7 kg)  08/22/15 167 lb (75.8 kg)   Constitutional:  Well-developed, in no acute distress. Psychiatric: Normal mood and affect. Behavior is normal. HEENT: Pupils normal.  Conjunctivae are normal. No scleral icterus. Neck supple.  Cardiovascular: Normal rate, regular rhythm. No edema Pulmonary/chest: Effort normal and breath sounds normal. No wheezing, rales or rhonchi. Abdominal: Soft, nondistended. Nontender. Bowel sounds active throughout. There  are no masses palpable. No hepatomegaly. Rectal:  defered Neurological: Alert and oriented to person place and time. Skin: Skin is warm and dry. No rashes noted.  Data Reviewed: I have personally reviewed following labs and imaging studies  CBC: CBC Latest Ref Rng & Units 10/26/2018 03/23/2013 09/15/2006  WBC 4.0 - 10.5 K/uL 10.2 14.8(H) 11.8(H)  Hemoglobin 12.0 - 15.0 g/dL 13.7 12.5 10.7(L)  Hematocrit 36 - 46 % 41.9 38.0 31.3(L)  Platelets 150 - 400 K/uL 431(H) 412(H) 268    CMP: CMP Latest Ref Rng & Units 10/26/2018 03/23/2013  Glucose 70 - 99 mg/dL 123(H) 118(H)  BUN 6 - 20 mg/dL 8 8  Creatinine 0.44 - 1.00 mg/dL 0.83 0.68  Sodium 135 - 145 mmol/L 138 136  Potassium 3.5 - 5.1 mmol/L 3.5 3.1(L)  Chloride 98 - 111 mmol/L 105 101  CO2 22 - 32 mmol/L 21(L) 22  Calcium 8.9 - 10.3 mg/dL 9.3 9.0      Carmell Austria, MD 09/15/2019, 11:22 AM  Cc: Imagene Riches, NP

## 2019-09-15 NOTE — Patient Instructions (Addendum)
If you are age 54 or older, your body mass index should be between 23-30. Your Body mass index is 22.01 kg/m. If this is out of the aforementioned range listed, please consider follow up with your Primary Care Provider.  If you are age 31 or younger, your body mass index should be between 19-25. Your Body mass index is 22.01 kg/m. If this is out of the aformentioned range listed, please consider follow up with your Primary Care Provider.   You have been given GERD literature.  Patient wants to hold off on EGD/Colon.  Follow up in six months. Please call the office for an appointment as the schedule is not available at this time.  Thank you for choosing me and [facility].   Lynann Bologna, MD

## 2019-09-19 ENCOUNTER — Ambulatory Visit: Payer: Self-pay | Admitting: Allergy and Immunology

## 2020-01-04 IMAGING — DX CHEST - 2 VIEW
2 series · 2 of 2 positions shown · non-contrast
Comparison: August 22, 2015

CLINICAL DATA: Chest pain

EXAM:
CHEST - 2 VIEW

[chest pa]
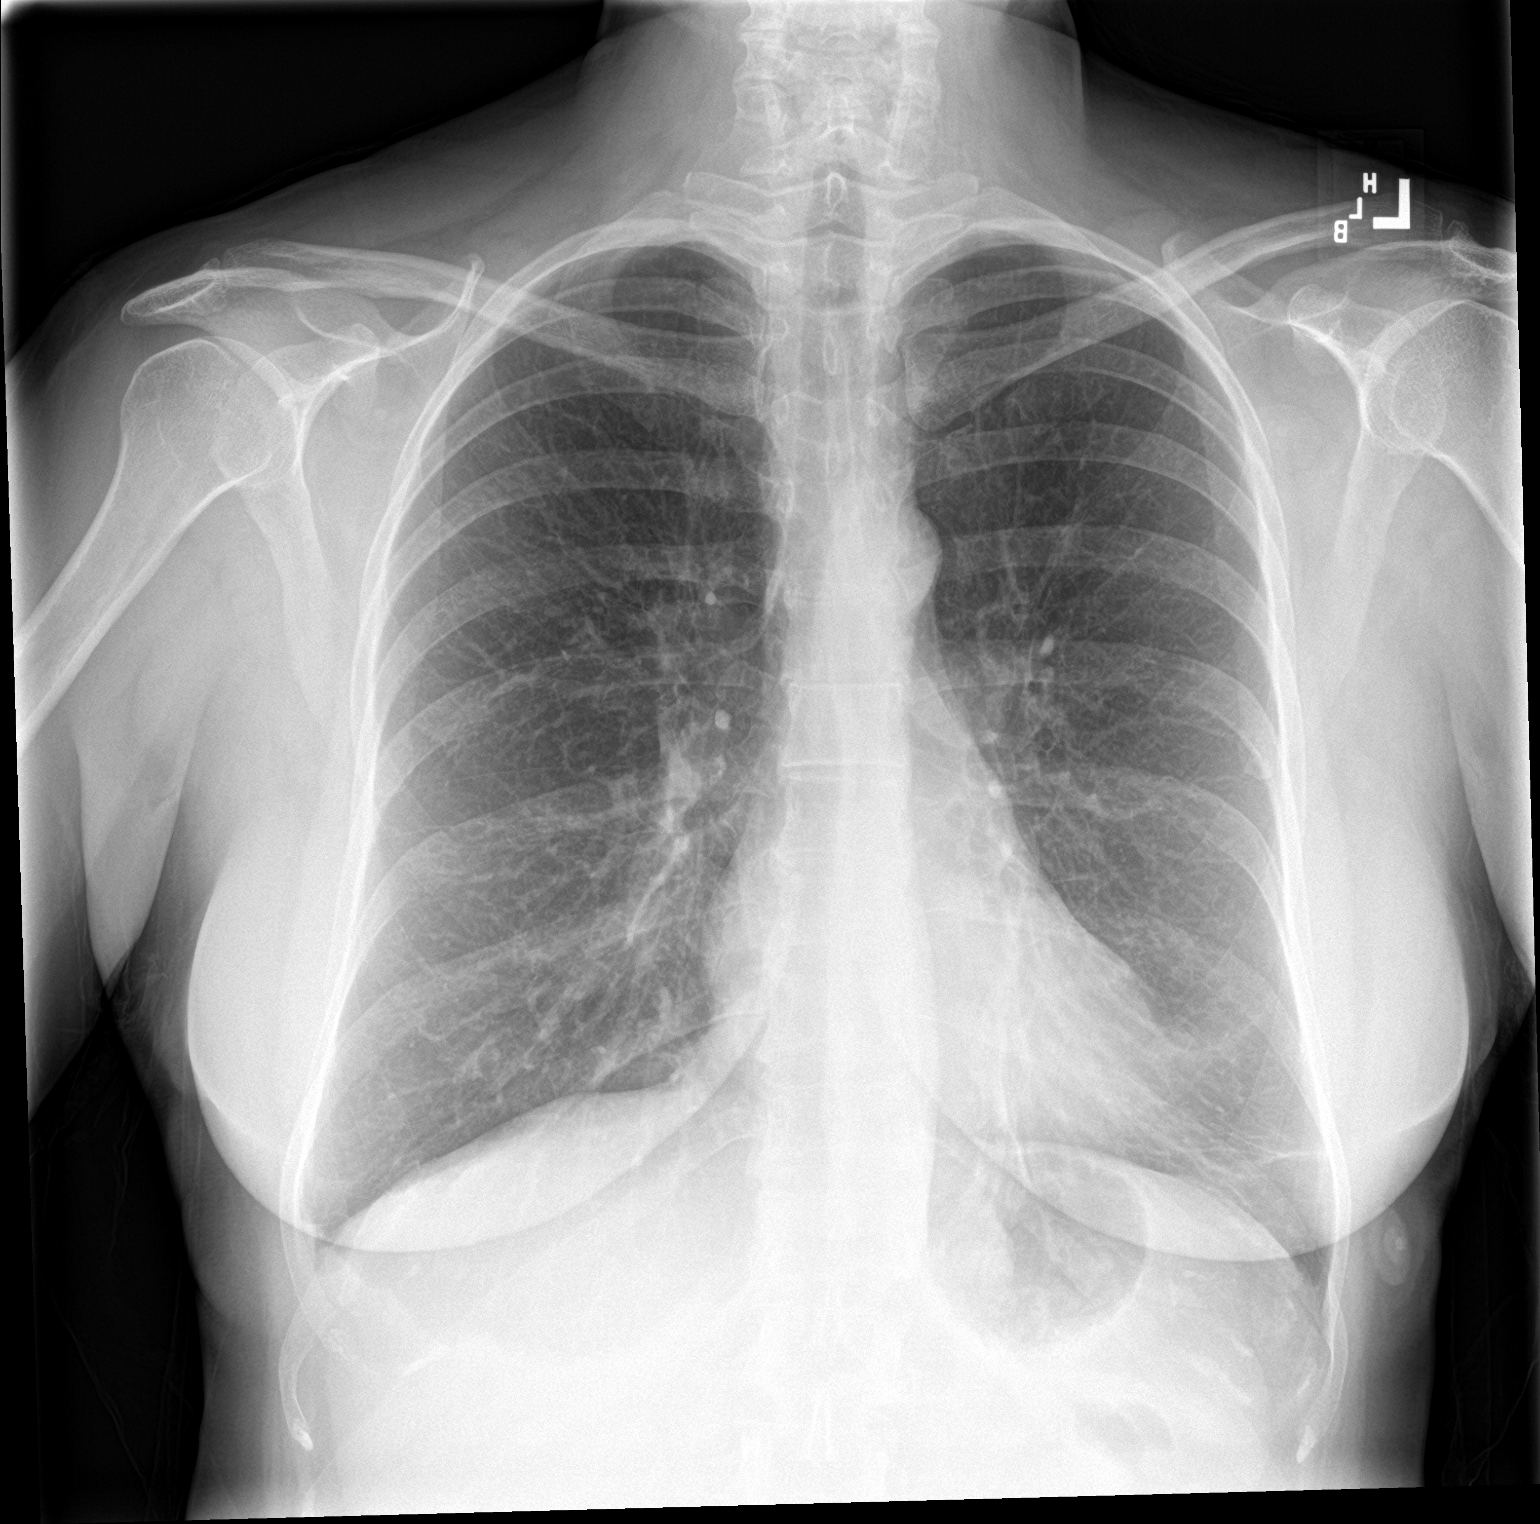

[chest lat]
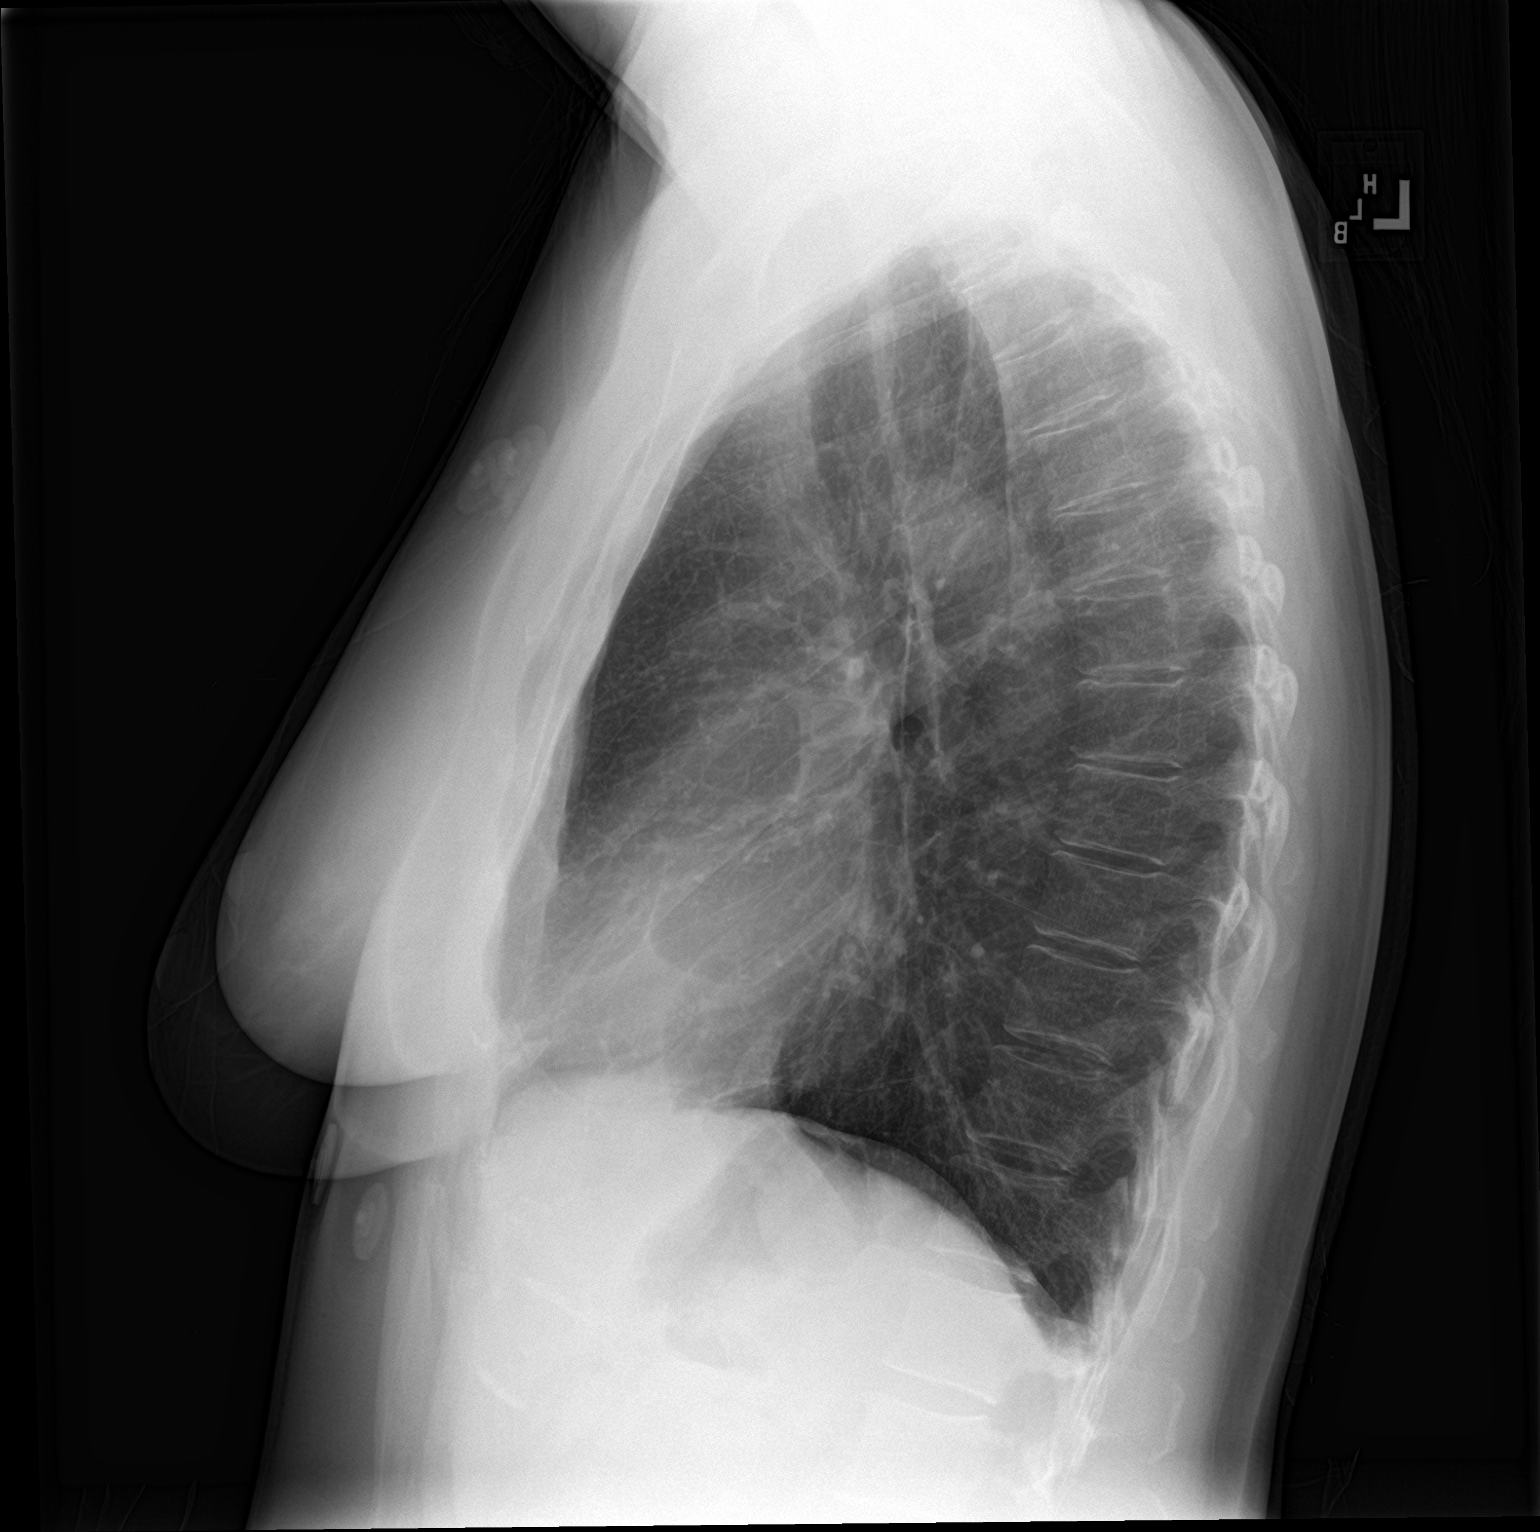

[2 of 2 positions shown; findings below may reference images not displayed]

FINDINGS: There is scarring in the left base. The lungs elsewhere are clear.
Heart size and pulmonary vascularity are normal. No adenopathy. No
bone lesions. No pneumothorax.
IMPRESSION: Scarring left base. No edema or consolidation. Cardiac silhouette
within normal limits.

## 2021-07-30 ENCOUNTER — Ambulatory Visit: Payer: No Typology Code available for payment source | Admitting: Licensed Clinical Social Worker

## 2021-07-30 DIAGNOSIS — F4321 Adjustment disorder with depressed mood: Secondary | ICD-10-CM

## 2021-07-30 NOTE — BH Specialist Note (Signed)
Integrated Behavioral Health Initial In-Person Visit ? ?MRN: [id number] ?Name: Rhonda Mckinney ? ?Number of Integrated Behavioral Health Clinician visits: 1- Initial Visit ? ?Session Start time: 1130 ?   ?Session End time: 1215 ? ?Total time in minutes: 45 ? ? ?Types of Service: Individual psychotherapy ? ?Interpretor:No. Interpretor Name and Language: NA ? ? ? Warm Hand Off Completed. ? ?  ? ?  ? ? ?Subjective: ?Rhonda Mckinney is a 56 y.o. female accompanied by  Self ?Patient was referred by Rhonda Mckinney for Caregiver Distress and [employer] . ?Patient reports the following symptoms/concerns: Feeling overwhelmed, and concerned for the safety and wellbeing of father .   ?Duration of problem: Several Months ; Severity of problem: moderate ? ?Objective: ?Mood: Anxious and Affect: Appropriate ?Risk of harm to self or others: No plan to harm self or others ? 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  ?Self-Care: Pt reports feeling stressed and anxious over the duties of care of her parents especially with care of her father who has dementia which has progressed.  ?Life Changes: Pt is the caregiver of her parents including father who is is trying to possible place in nursing home.  ? ?Patient and/or Family's Strengths/Protective Factors: ?Social connections and Concrete supports in place (healthy food, safe environments, etc.) ? ?Goals Addressed: ?Patient will: ?Reduce symptoms of: stress ?Increase knowledge and/or ability of: coping skills and stress reduction  ?Demonstrate ability to: Increase healthy adjustment to current life circumstances and Increase adequate support systems for patient/family ? ?Progress towards Goals: ?Ongoing ? ?Interventions: ?Interventions utilized: Solution-Focused Strategies,  Mindfulness or Management consultant, and Link to [employer]  ?Standardized Assessments completed: Not Needed ? ?Patient and/or Family Response: Pt is open to resources , suggestions to help with caregiver burden and strain she is currently feeling,   ? ?Patient Centered Plan: ?Patient is on the following Treatment Plan(s):  Link up to community resources, assist with formal and informal supports .  Increase knowledge of father's disease.   ? ?Assessment: ?Patient currently experiencing Pt is currently feeling dispar, strain with being a caregiver to parents including father who has dementia with behavioral issues .  In addition, feelings overwhelmed with trying to possible find resources or possible placement for her father . ?  ?Patient may benefit from Increase formal and informal support system increase knowledge of her father's condition . ? ?Plan: ?Follow up with behavioral health clinician on : As needed  ?Behavioral recommendations: Look at adult day center such as [facility], [facility], Special Assistance Medicaid for Placement, Respite through [place], Support group options.  ?Referral(s): Community Resources:  Listed above ?"From scale of 1-10, how likely are you to follow plan?": 10 ? ?Rhonda Caruth A Taylor-Paladino, LCSW ? ? ? ? ? ? ? ? ?

## 2021-08-01 ENCOUNTER — Telehealth: Payer: Self-pay | Admitting: Licensed Clinical Social Worker

## 2021-08-01 NOTE — Telephone Encounter (Signed)
Student social worker, Eartha Inch, called the Pt to check in on how things were going following Pt's appointment with Althea Charon on Tuesday. Pt indicated that things were the same, "not good", and said she felt like she was going in circles with everything. Pt indicated frustration with how much she had to do on top of how difficult it is to get everything filled out for medicaid and VA benefits. Pt indicated that she had yet to talk with [facility] or [facility] (both of which had been recommended to her by multiple sources), and that she had spent hours on the phone with other places just trying to get everything in order. Pt indicated she was going to call the [place] office later today to continue the process she described as horrible. Student Education officer, museum provided supportive counseling and emailed the Pt a list of respite resources, as well as online resources like care.com and [email or web address] to help ease time on the phone.  ?

## 2023-03-20 ENCOUNTER — Ambulatory Visit: Payer: 59 | Admitting: Internal Medicine

## 2023-03-20 VITALS — BP 110/60 | HR 63 | Ht 67.0 in | Wt 167.2 lb

## 2023-03-20 DIAGNOSIS — E0500 Thyrotoxicosis with diffuse goiter without thyrotoxic crisis or storm: Secondary | ICD-10-CM | POA: Diagnosis not present

## 2023-03-20 NOTE — Progress Notes (Unsigned)
Name: Rhonda Mckinney  MRN/ DOB: [id number], 18-Jul-1965    Age/ Sex: 57 y.o., female    PCP: Patient, No Pcp Per   Reason for Endocrinology Evaluation: Graves' disease     Date of Initial Endocrinology Evaluation: 03/20/2023     HPI: Rhonda Mckinney is a 57 y.o. female with unremarkable  past medical history . The patient presented for initial endocrinology clinic visit on 03/20/2023 for consultative assistance with her Luiz Blare' disease.   Patient has been noted with low TSH 0.4 u IU/mL, with normal free T33.57 PG/mL (reference 2.5-3.9), free T41.1 NG/DL (9.51-8.84) in January 2024.  This was noted during evaluation for palpitations   Patient had thyroid uptake and scan 04/30/2022 with mildly elevated 24-hour RAI uptake (36.5) , with homogenous tracer distribution in both thyroid lobes.  Findings consistent with Graves' disease   Has noted local neck swelling  Has GERD  Denies palpitations  Denies tremors  Denies Loose stools or diarrhea  No Hx of A.Fib  No Hx of osteoporosis  Has arthralgia   Has been eating brazilian nuts which resolved with palpitations  No FH of thyroid disease    HISTORY:  Past Medical History:  Past Medical History:  Diagnosis Date   Asthma    Asthma    LPRD (laryngopharyngeal reflux disease)    Mitral valve prolapse    PNA (pneumonia)    Thyroid disease    Past Surgical History:  Past Surgical History:  Procedure Laterality Date   KNEE SURGERY     MULTIPLE TOOTH EXTRACTIONS     WISDOM TOOTH EXTRACTION      Social History:  reports that she has quit smoking. She has never used smokeless tobacco. She reports that she does not drink alcohol and does not use drugs. Family History: family history includes Breast cancer in her mother; Cirrhosis in her mother; Colon polyps in her brother; Crohn's disease in her mother; Diabetes in her mother; Diverticulitis in her brother.   HOME MEDICATIONS: Allergies as of 03/20/2023       Reactions    Almond (diagnostic)    Banana    Egg [egg-derived Products]    Milk-related Compounds    Wheat    Asa [aspirin] Nausea Only        Medication List        Accurate as of March 20, 2023  1:58 PM. If you have any questions, ask your nurse or doctor.          COLLAGEN PO Take by mouth daily.   multivitamin tablet Take 1 tablet by mouth as needed.          REVIEW OF SYSTEMS: A comprehensive ROS was conducted with the patient and is negative except     OBJECTIVE:  VS: BP 110/60   Pulse 63   Ht 5\' 7"  (1.702 m)   Wt 167 lb 3.2 oz (75.8 kg)   SpO2 98%   BMI 26.19 kg/m    Wt Readings from Last 3 Encounters:  03/20/23 167 lb 3.2 oz (75.8 kg)  09/15/19 140 lb 8 oz (63.7 kg)  08/22/15 167 lb (75.8 kg)     EXAM: General: Pt appears well and is in NAD  Eyes: External eye exam normal without stare, lid lag or exophthalmos.  EOM intact.   Neck: General: Supple without adenopathy. Thyroid: Thyroid size normal.  No goiter or nodules appreciated. No thyroid bruit.  Lungs: Clear with good BS bilat   Heart:  Auscultation: RRR.  Abdomen: Soft, nontender  Extremities:  BL LE: No pretibial edema   Mental Status: Judgment, insight: Intact Orientation: Oriented to time, place, and person Mood and affect: No depression, anxiety, or agitation     DATA REVIEWED: ***    Thyroid uptake and scan 04/30/2022 24-hour I-131 uptake = 36.5  Impression Mildly elevated 24-hour radioiodine uptake with homogenous tracer distribution in both thyroid lobes.  Findings consistent with Graves' disease   ASSESSMENT/PLAN/RECOMMENDATIONS:   Graves' disease  - Pt is clinically euthyroid  - We discussed that Graves' Disease is a result of an autoimmune condition involving the thyroid.  -We discussed pathophysiology of hyperthyroidism     F/U 6 months   Signed electronically by: Lyndle Herrlich, MD  [facility]  [facility] [address] [place], [place] [address] Phone: [phone] FAX: [phone]   CC: Patient, No Pcp Per No address on file Phone: None Fax: None   Return to Endocrinology clinic as below: Future Appointments  Date Time Provider Department Center  09/21/2023  1:00 PM Jawon Dipiero, Konrad Dolores, MD [facility] None

## 2023-03-24 LAB — T4, FREE: Free T4: 1.2 ng/dL (ref 0.8–1.8)

## 2023-03-24 LAB — TRAB (TSH RECEPTOR BINDING ANTIBODY): TRAB: <1 [IU]/L (ref <=2.00)

## 2023-03-24 LAB — T3, FREE: T3, Free: 3.8 pg/mL (ref 2.3–4.2)

## 2023-03-24 LAB — TSH: TSH: 0.84 m[IU]/L (ref 0.40–4.50)

## 2023-07-16 ENCOUNTER — Telehealth: Payer: Self-pay | Admitting: Gastroenterology

## 2023-07-16 NOTE — Telephone Encounter (Signed)
 Good morning Dr. Venice Gillis  The following patient is requesting to be released from your care and transferred to Dr. Leonia Raman. Are you willing to release this patient. Please advise. Thank you.

## 2023-07-16 NOTE — Telephone Encounter (Signed)
 Absolutely She will be under great care with Dr. Nandigam RG

## 2023-07-21 NOTE — Telephone Encounter (Signed)
 Good afternoon Dr Leonia Raman  Are you willing to take this patient and continue care with them. Please advise to schedule. Thank you

## 2023-08-03 NOTE — Telephone Encounter (Signed)
 I do not have any available appointments for many weeks, patient will likely be seen by APP

## 2023-08-05 NOTE — Telephone Encounter (Signed)
 Spoke with patient. She will call and schedule if she has any concerns

## 2023-09-21 ENCOUNTER — Ambulatory Visit: Payer: 59 | Admitting: Internal Medicine
